# Patient Record
Sex: Male | Born: 1995 | Hispanic: Yes | Marital: Married | Smoking: Never smoker
Health system: Southern US, Community
[De-identification: ages and names within clinical notes are randomized; demographics above are authoritative.]

## PROBLEM LIST (undated history)

## (undated) DIAGNOSIS — J45909 Unspecified asthma, uncomplicated: Secondary | ICD-10-CM

---

## 2013-02-26 ENCOUNTER — Emergency Department (HOSPITAL_BASED_OUTPATIENT_CLINIC_OR_DEPARTMENT_OTHER)
Admission: EM | Admit: 2013-02-26 | Discharge: 2013-02-27 | Disposition: A | Discharge status: Home / Self Care | Payer: Self-pay | Attending: Emergency Medicine | Admitting: Emergency Medicine

## 2013-02-26 ENCOUNTER — Encounter (HOSPITAL_BASED_OUTPATIENT_CLINIC_OR_DEPARTMENT_OTHER): Payer: Self-pay | Admitting: *Deleted

## 2013-02-26 ENCOUNTER — Emergency Department (HOSPITAL_BASED_OUTPATIENT_CLINIC_OR_DEPARTMENT_OTHER): Payer: Self-pay

## 2013-02-26 DIAGNOSIS — S93409A Sprain of unspecified ligament of unspecified ankle, initial encounter: Secondary | ICD-10-CM | POA: Insufficient documentation

## 2013-02-26 DIAGNOSIS — S93401A Sprain of unspecified ligament of right ankle, initial encounter: Secondary | ICD-10-CM

## 2013-02-26 DIAGNOSIS — Y9239 Other specified sports and athletic area as the place of occurrence of the external cause: Secondary | ICD-10-CM | POA: Insufficient documentation

## 2013-02-26 DIAGNOSIS — W219XXA Striking against or struck by unspecified sports equipment, initial encounter: Secondary | ICD-10-CM | POA: Insufficient documentation

## 2013-02-26 DIAGNOSIS — Y9366 Activity, soccer: Secondary | ICD-10-CM | POA: Insufficient documentation

## 2013-02-26 NOTE — ED Notes (Signed)
To xray via w/c.

## 2013-02-26 NOTE — ED Notes (Signed)
Patient was playing soccer and thinks he sprained his right ankle

## 2013-02-26 NOTE — ED Provider Notes (Signed)
CSN: [id number]     Arrival date & time 02/26/13  2244 History     First MD Initiated Contact with Patient 02/26/13 2256     Chief Complaint  Patient presents with  . Ankle Pain   (Consider location/radiation/quality/duration/timing/severity/associated sxs/prior Treatment) HPI Comments: Otherwise healthy 17 year old male who presents to the ER after playing soccer and having another player run into him and hit him in the right ankle - reports unable to walk on the ankle without pain.  No prior injury to the area.  Patient is a 17 y.o. male presenting with ankle pain. The history is provided by the patient. No language interpreter was used.  Ankle Pain Location:  Ankle Time since incident:  2 hours Injury: yes   Mechanism of injury comment:  Playing soccer Ankle location:  R ankle Pain details:    Quality:  Aching   Radiates to:  Does not radiate   Severity:  Moderate   Onset quality:  Sudden   Timing:  Constant   Progression:  Worsening Chronicity:  New Dislocation: no   Foreign body present:  No foreign bodies Tetanus status:  Unknown Prior injury to area:  No Relieved by:  Nothing Worsened by:  Nothing tried Ineffective treatments:  None tried Associated symptoms: decreased ROM   Associated symptoms: no back pain, no fatigue, no fever, no itching, no muscle weakness, no neck pain, no numbness, no stiffness, no swelling and no tingling     History reviewed. No pertinent past medical history. History reviewed. No pertinent past surgical history. History reviewed. No pertinent family history. History  Substance Use Topics  . Smoking status: Never Smoker   . Smokeless tobacco: Not on file  . Alcohol Use: No    Review of Systems  Constitutional: Negative for fever and fatigue.  HENT: Negative for neck pain.   Musculoskeletal: Negative for back pain and stiffness.  Skin: Negative for itching.  All other systems reviewed and are negative.    Allergies  Review of  patient's allergies indicates no known allergies.  Home Medications  No current outpatient prescriptions on file. BP 111/49  Pulse 69  Temp(Src) 98.2 F (36.8 C) (Oral)  Resp 20  Ht 5\' 8"  (1.727 m)  Wt 150 lb (68.04 kg)  BMI 22.81 kg/m2  SpO2 100% Physical Exam  Nursing note and vitals reviewed. Constitutional: He is oriented to person, place, and time. He appears well-developed and well-nourished. No distress.  HENT:  Head: Normocephalic and atraumatic.  Right Ear: External ear normal.  Left Ear: External ear normal.  Nose: Nose normal.  Mouth/Throat: Oropharynx is clear and moist. No oropharyngeal exudate.  Eyes: Conjunctivae and EOM are normal. Pupils are equal, round, and reactive to light.  Neck: Normal range of motion. Neck supple.  Cardiovascular: Normal rate, regular rhythm and normal heart sounds.  Exam reveals no gallop and no friction rub.   No murmur heard. Pulmonary/Chest: Effort normal and breath sounds normal. No respiratory distress. He has no wheezes.  Abdominal: Soft. Bowel sounds are normal. He exhibits no distension. There is no tenderness.  Musculoskeletal:       Right ankle: He exhibits swelling. He exhibits normal range of motion, no ecchymosis, no deformity and normal pulse. Tenderness. Lateral malleolus tenderness found. Achilles tendon normal.       Feet:  Neurological: He is alert and oriented to person, place, and time. No cranial nerve deficit.  Skin: Skin is warm and dry. No rash noted. No erythema.  No pallor.  Psychiatric: He has a normal mood and affect. His behavior is normal. Judgment and thought content normal.    ED Course   Procedures (including critical care time)  Labs Reviewed - No data to display No results found for this or any previous visit. Dg Ankle Complete Right  02/27/2013   *RADIOLOGY REPORT*  Clinical Data: 17 year old male status post injury with pain laterally.  RIGHT ANKLE - COMPLETE 3+ VIEW  Comparison: None.  Findings:  Normal mortise joint alignment.  Talar dome intact. Calcaneus intact. Bone mineralization is within normal limits.  No acute fracture identified.  IMPRESSION: No acute fracture or dislocation identified about the right ankle.   Original Report Authenticated By: Erskine Speed, M.D.   Dg Foot Complete Right  02/27/2013   *RADIOLOGY REPORT*  Clinical Data: 17 year old male status post injury with pain.  RIGHT FOOT COMPLETE - 3+ VIEW  Comparison: Right ankle series from the same day reported separately.  Findings: Bone mineralization is within normal limits.  Calcaneus intact.  Joint spaces and alignment within normal limits.  No acute fracture identified.  IMPRESSION: No acute fracture or dislocation identified about the right foot.   Original Report Authenticated By: Erskine Speed, M.D.    Right ankle sprain No results found. No diagnosis found.  MDM  Patient with twisting injury to right ankle - no fracture, will place in ASO and crutches and short course of pain medication.  Izola Price Marisue Humble, PA-C 02/27/13 0023

## 2013-02-26 NOTE — ED Notes (Signed)
Return from xray condition unchanged

## 2013-02-27 MED ORDER — HYDROCODONE-ACETAMINOPHEN 5-325 MG PO TABS: 1.0000 | ORAL_TABLET | ORAL | Status: DC | PRN

## 2013-02-27 NOTE — Discharge Instructions (Signed)

## 2013-02-27 NOTE — ED Provider Notes (Signed)
Medical screening examination/treatment/procedure(s) were performed by non-physician practitioner and as supervising physician I was immediately available for consultation/collaboration.   Marcelo Ickes, MD 02/27/13 0146 

## 2013-12-21 ENCOUNTER — Encounter (HOSPITAL_BASED_OUTPATIENT_CLINIC_OR_DEPARTMENT_OTHER): Payer: Self-pay | Admitting: Emergency Medicine

## 2013-12-21 ENCOUNTER — Emergency Department (HOSPITAL_BASED_OUTPATIENT_CLINIC_OR_DEPARTMENT_OTHER)
Admission: EM | Admit: 2013-12-21 | Discharge: 2013-12-21 | Disposition: A | Discharge status: Home / Self Care | Payer: 59 | Attending: Emergency Medicine | Admitting: Emergency Medicine

## 2013-12-21 DIAGNOSIS — K529 Noninfective gastroenteritis and colitis, unspecified: Secondary | ICD-10-CM

## 2013-12-21 DIAGNOSIS — K5289 Other specified noninfective gastroenteritis and colitis: Secondary | ICD-10-CM | POA: Insufficient documentation

## 2013-12-21 DIAGNOSIS — Z79899 Other long term (current) drug therapy: Secondary | ICD-10-CM | POA: Insufficient documentation

## 2013-12-21 MED ORDER — ONDANSETRON 4 MG PO TBDP
4.0000 mg | ORAL_TABLET | Freq: Once | ORAL | Status: AC
  Administered 2013-12-21: 4 mg via ORAL
  Filled 2013-12-21: qty 1

## 2013-12-21 MED ORDER — ONDANSETRON HCL 4 MG PO TABS: 4.0000 mg | ORAL_TABLET | Freq: Four times a day (QID) | ORAL | Status: DC

## 2013-12-21 NOTE — ED Provider Notes (Addendum)
CSN: [id number]     Arrival date & time 12/21/13  1058 History   First MD Initiated Contact with Patient 12/21/13 1112     Chief Complaint  Patient presents with  . Diarrhea     (Consider location/radiation/quality/duration/timing/severity/associated sxs/prior Treatment) Patient is a 18 y.o. male presenting with diarrhea. The history is provided by the patient.  Diarrhea Quality:  Watery Severity:  Moderate Onset quality:  Sudden Number of episodes:  10 since last night but only 1 today Duration:  12 hours Timing:  Constant Progression:  Improving Relieved by:  None tried Worsened by:  Nothing tried Associated symptoms: vomiting   Associated symptoms: no abdominal pain and no fever   Risk factors: sick contacts   Risk factors: no recent antibiotic use, no suspicious food intake and no travel to endemic areas     History reviewed. No pertinent past medical history. History reviewed. No pertinent past surgical history. No family history on file. History  Substance Use Topics  . Smoking status: Never Smoker   . Smokeless tobacco: Not on file  . Alcohol Use: No    Review of Systems  Constitutional: Negative for fever.  Gastrointestinal: Positive for vomiting and diarrhea. Negative for abdominal pain.  All other systems reviewed and are negative.     Allergies  Review of patient's allergies indicates no known allergies.  Home Medications   Prior to Admission medications   Medication Sig Start Date End Date Taking? Authorizing Provider  HYDROcodone-acetaminophen (NORCO/VICODIN) 5-325 MG per tablet Take 1 tablet by mouth every 4 (four) hours as needed for pain. 02/27/13   Izola Price. Sanford, PA-C   BP 118/54  Pulse 72  Temp(Src) 98.4 F (36.9 C) (Oral)  Resp 16  Ht 5\' 10"  (1.778 m)  Wt 160 lb (72.576 kg)  BMI 22.96 kg/m2  SpO2 100% Physical Exam  Nursing note and vitals reviewed. Constitutional: He is oriented to person, place, and time. He appears  well-developed and well-nourished. No distress.  HENT:  Head: Normocephalic and atraumatic.  Mouth/Throat: Oropharynx is clear and moist.  Eyes: Conjunctivae and EOM are normal. Pupils are equal, round, and reactive to light.  Neck: Normal range of motion. Neck supple.  Cardiovascular: Normal rate, regular rhythm and intact distal pulses.   No murmur heard. Pulmonary/Chest: Effort normal and breath sounds normal. No respiratory distress. He has no wheezes. He has no rales.  Abdominal: Soft. He exhibits no distension. There is no tenderness. There is no rebound and no guarding.  Musculoskeletal: Normal range of motion. He exhibits no edema and no tenderness.  Neurological: He is alert and oriented to person, place, and time.  Skin: Skin is warm and dry. No rash noted. No erythema.  Psychiatric: He has a normal mood and affect. His behavior is normal.    ED Course  Procedures (including critical care time) Labs Review Labs Reviewed - No data to display  Imaging Review No results found.   EKG Interpretation None      MDM   Final diagnoses:  Gastroenteritis    Pt with symptoms most consistent with a viral process with vomitting/diarrhea and girlfriend with similar sx.  Denies bad food exposure and recent travel out of the country.  No recent abx.  No hx concerning for GU pathology or kidney stones.  Pt is awake and alert on exam without peritoneal signs. Given ODT zofran and will po challenge.      Gwyneth Sprout, MD 12/21/13 1124  Gwyneth Sprout, MD 12/21/13  1226 

## 2013-12-21 NOTE — ED Notes (Signed)
C/o n/v/d-started yesterday-NAD-steady gait into triage

## 2014-05-16 ENCOUNTER — Encounter (HOSPITAL_BASED_OUTPATIENT_CLINIC_OR_DEPARTMENT_OTHER): Payer: Self-pay | Admitting: *Deleted

## 2014-05-16 ENCOUNTER — Emergency Department (HOSPITAL_BASED_OUTPATIENT_CLINIC_OR_DEPARTMENT_OTHER): Payer: 59

## 2014-05-16 ENCOUNTER — Emergency Department (HOSPITAL_BASED_OUTPATIENT_CLINIC_OR_DEPARTMENT_OTHER)
Admission: EM | Admit: 2014-05-16 | Discharge: 2014-05-16 | Disposition: A | Discharge status: Home / Self Care | Payer: 59 | Attending: Emergency Medicine | Admitting: Emergency Medicine

## 2014-05-16 DIAGNOSIS — R197 Diarrhea, unspecified: Secondary | ICD-10-CM | POA: Insufficient documentation

## 2014-05-16 DIAGNOSIS — R109 Unspecified abdominal pain: Secondary | ICD-10-CM

## 2014-05-16 DIAGNOSIS — R110 Nausea: Secondary | ICD-10-CM | POA: Insufficient documentation

## 2014-05-16 LAB — CBC WITH DIFFERENTIAL/PLATELET
BASOS ABS: 0 10*3/uL (ref 0.0–0.1)
BASOS PCT: 0 % (ref 0–1)
Eosinophils Absolute: 0.1 10*3/uL (ref 0.0–0.7)
Eosinophils Relative: 1 % (ref 0–5)
HCT: 40.8 % (ref 39.0–52.0)
HEMOGLOBIN: 14.3 g/dL (ref 13.0–17.0)
Lymphocytes Relative: 30 % (ref 12–46)
Lymphs Abs: 2.8 10*3/uL (ref 0.7–4.0)
MCH: 30.4 pg (ref 26.0–34.0)
MCHC: 35 g/dL (ref 30.0–36.0)
MCV: 86.8 fL (ref 78.0–100.0)
Monocytes Absolute: 0.9 10*3/uL (ref 0.1–1.0)
Monocytes Relative: 10 % (ref 3–12)
NEUTROS ABS: 5.6 10*3/uL (ref 1.7–7.7)
NEUTROS PCT: 59 % (ref 43–77)
Platelets: 222 10*3/uL (ref 150–400)
RBC: 4.7 MIL/uL (ref 4.22–5.81)
RDW: 12.3 % (ref 11.5–15.5)
WBC: 9.4 10*3/uL (ref 4.0–10.5)

## 2014-05-16 LAB — URINALYSIS, ROUTINE W REFLEX MICROSCOPIC
BILIRUBIN URINE: NEGATIVE
Glucose, UA: NEGATIVE mg/dL
HGB URINE DIPSTICK: NEGATIVE
Ketones, ur: NEGATIVE mg/dL
Leukocytes, UA: NEGATIVE
NITRITE: NEGATIVE
PH: 6.5 (ref 5.0–8.0)
Protein, ur: NEGATIVE mg/dL
SPECIFIC GRAVITY, URINE: 1.023 (ref 1.005–1.030)
Urobilinogen, UA: 1 mg/dL (ref 0.0–1.0)

## 2014-05-16 LAB — COMPREHENSIVE METABOLIC PANEL
ALBUMIN: 3.8 g/dL (ref 3.5–5.2)
ALK PHOS: 91 U/L (ref 39–117)
ALT: 33 U/L (ref 0–53)
AST: 25 U/L (ref 0–37)
Anion gap: 11 (ref 5–15)
BILIRUBIN TOTAL: 1 mg/dL (ref 0.3–1.2)
BUN: 15 mg/dL (ref 6–23)
CHLORIDE: 103 meq/L (ref 96–112)
CO2: 28 mEq/L (ref 19–32)
Calcium: 9.4 mg/dL (ref 8.4–10.5)
Creatinine, Ser: 1 mg/dL (ref 0.50–1.35)
GFR calc Af Amer: >90 mL/min (ref >90)
GFR calc non Af Amer: >90 mL/min (ref >90)
Glucose, Bld: 90 mg/dL (ref 70–99)
POTASSIUM: 4.1 meq/L (ref 3.7–5.3)
Sodium: 142 mEq/L (ref 137–147)
Total Protein: 7.5 g/dL (ref 6.0–8.3)

## 2014-05-16 LAB — LIPASE, BLOOD: Lipase: 31 U/L (ref 11–59)

## 2014-05-16 MED ORDER — ONDANSETRON HCL 4 MG PO TABS: 4.0000 mg | ORAL_TABLET | Freq: Four times a day (QID) | ORAL | Status: AC

## 2014-05-16 MED ORDER — IOHEXOL 300 MG/ML  SOLN
25.0000 mL | Freq: Once | INTRAMUSCULAR | Status: AC | PRN
  Administered 2014-05-16: 25 mL via ORAL

## 2014-05-16 MED ORDER — IOHEXOL 300 MG/ML  SOLN
100.0000 mL | Freq: Once | INTRAMUSCULAR | Status: AC | PRN
  Administered 2014-05-16: 100 mL via INTRAVENOUS

## 2014-05-16 NOTE — ED Notes (Signed)
Pt c/o n/d x 2 days denies abd pain.

## 2014-05-16 NOTE — ED Notes (Signed)
Patient transported to CT 

## 2014-05-16 NOTE — ED Provider Notes (Signed)
CSN: [id number]     Arrival date & time 05/16/14  1455 History   First MD Initiated Contact with Patient 05/16/14 1624     Chief Complaint  Patient presents with  . Diarrhea     (Consider location/radiation/quality/duration/timing/severity/associated sxs/prior Treatment) HPI Comments: Patient with 2 day history of nausea, diarrhea and abdominal pain. No vomiting, no fever. No urinary symptoms. He has had sick contacts at work with similar symptoms. No recent travel or antibiotic use. Denies any fever. Denies any chest pain or shortness of breath. States he has had 7-8 episodes of nonbloody diarrhea today that is loose and watery. Denies any blood in the stool. Denies any vomiting. Still has an appetite.  The history is provided by the patient.    History reviewed. No pertinent past medical history. History reviewed. No pertinent past surgical history. History reviewed. No pertinent family history. History  Substance Use Topics  . Smoking status: Never Smoker   . Smokeless tobacco: Not on file  . Alcohol Use: No    Review of Systems  Constitutional: Positive for activity change and appetite change. Negative for fever.  HENT: Negative for congestion and rhinorrhea.   Eyes: Negative for visual disturbance.  Respiratory: Negative for cough, chest tightness and shortness of breath.   Cardiovascular: Negative for chest pain.  Gastrointestinal: Positive for nausea, abdominal pain and diarrhea. Negative for vomiting.  Genitourinary: Negative for dysuria, hematuria and testicular pain.  Musculoskeletal: Negative for myalgias, back pain and arthralgias.  Skin: Negative for rash.  Neurological: Negative for dizziness, weakness and headaches.  A complete 10 system review of systems was obtained and all systems are negative except as noted in the HPI and PMH.      Allergies  Review of patient's allergies indicates no known allergies.  Home Medications   Prior to Admission  medications   Medication Sig Start Date End Date Taking? Authorizing Provider  ondansetron (ZOFRAN) 4 MG tablet Take 1 tablet (4 mg total) by mouth every 6 (six) hours. 05/16/14   Glynn OctaveStephen Keyonta Madrid, MD   BP 123/72 mmHg  Pulse 50  Temp(Src) 98.1 F (36.7 C) (Oral)  Resp 16  Ht 5\' 10"  (1.778 m)  Wt 160 lb (72.576 kg)  BMI 22.96 kg/m2  SpO2 100% Physical Exam  Constitutional: He is oriented to person, place, and time. He appears well-developed and well-nourished. No distress.  HENT:  Head: Normocephalic and atraumatic.  Mouth/Throat: Oropharynx is clear and moist. No oropharyngeal exudate.  Eyes: Conjunctivae and EOM are normal. Pupils are equal, round, and reactive to light.  Neck: Normal range of motion. Neck supple.  No meningismus.  Cardiovascular: Normal rate, regular rhythm, normal heart sounds and intact distal pulses.   No murmur heard. Pulmonary/Chest: Effort normal and breath sounds normal. No respiratory distress.  Abdominal: Soft. There is tenderness. There is no rebound and no guarding.  TTP R abdomen  Musculoskeletal: Normal range of motion. He exhibits no edema or tenderness.  Neurological: He is alert and oriented to person, place, and time. No cranial nerve deficit. He exhibits normal muscle tone. Coordination normal.  No ataxia on finger to nose bilaterally. No pronator drift. 5/5 strength throughout. CN 2-12 intact. Negative Romberg. Equal grip strength. Sensation intact. Gait is normal.   Skin: Skin is warm.  Psychiatric: He has a normal mood and affect. His behavior is normal.  Nursing note and vitals reviewed.   ED Course  Procedures (including critical care time) Labs Review Labs Reviewed  CBC WITH DIFFERENTIAL  COMPREHENSIVE METABOLIC PANEL  LIPASE, BLOOD  URINALYSIS, ROUTINE W REFLEX MICROSCOPIC    Imaging Review Ct Abdomen Pelvis W Contrast  05/16/2014   CLINICAL DATA:  Generalized abdominal pain.  Diarrhea.  EXAM: CT ABDOMEN AND PELVIS WITH CONTRAST   TECHNIQUE: Multidetector CT imaging of the abdomen and pelvis was performed using the standard protocol following bolus administration of intravenous contrast.  CONTRAST:  25mL OMNIPAQUE IOHEXOL 300 MG/ML SOLN, 100mL OMNIPAQUE IOHEXOL 300 MG/ML SOLN  COMPARISON:  None.  FINDINGS: Lower Chest:  Unremarkable.  Hepatobiliary: No masses or other significant abnormality identified.  Pancreas: No mass, inflammatory changes, or other parenchymal abnormality identified.  Spleen:  Within normal limits in size and appearance.  Adrenal Glands:  No mass identified.  Kidneys/Urinary Tract: No masses identified. No evidence of hydronephrosis.  Stomach/Bowel/Peritoneum: No evidence of wall thickening, mass, or obstruction. Although the appendix is not well visualized on this exam, and no inflammatory process seen in the area of the cecum or elsewhere.  Vascular/Lymphatic: No pathologically enlarged lymph nodes identified. No other significant abnormality identified.  Reproductive:  No mass or other significant abnormality identified.  Other: Normal L5 pars defects noted without associated spondylolisthesis.  Musculoskeletal:  No suspicious bone lesions identified.  IMPRESSION: Negative. No acute findings or other significant abnormality identified.   Electronically Signed   By: Myles RosenthalJohn  Stahl M.D.   On: 05/16/2014 18:05     EKG Interpretation None      MDM   Final diagnoses:  Abdominal pain  Diarrhea   2 days of nausea, abdominal pain, diarrhea.  Sick contacts at work.  Labs unremarkable.  UA negative.  CT with appendix not identified but no inflammation seen.  Patient tolerating PO. No vomiting.  Suspect viral illness as source of diarrhea.  Continue symptom control and hydration at home.  Follow up with PCP. Return precautions discussed.  Glynn OctaveStephen Linus Weckerly, MD 05/17/14 [phone]

## 2014-05-16 NOTE — Discharge Instructions (Signed)

## 2015-09-22 IMAGING — CT CT ABD-PELV W/ CM
2 of 4 series · 17 of 46 positions shown, 19 images · IV contrast (omnipaque)
Comparison: None.

CLINICAL DATA: Generalized abdominal pain.  Diarrhea.

EXAM:
CT ABDOMEN AND PELVIS WITH CONTRAST
TECHNIQUE: Multidetector CT imaging of the abdomen and pelvis was performed
using the standard protocol following bolus administration of
intravenous contrast.
CONTRAST:  25mL OMNIPAQUE IOHEXOL 300 MG/ML SOLN, 100mL OMNIPAQUE
IOHEXOL 300 MG/ML SOLN

[Series 2: abd/pelvis 5.0 b31f · axial · 0.72mm/px · z∈[-476,-86]mm · 14 of 86 slices shown, 16 images]
[im 4/86  soft-tissue]
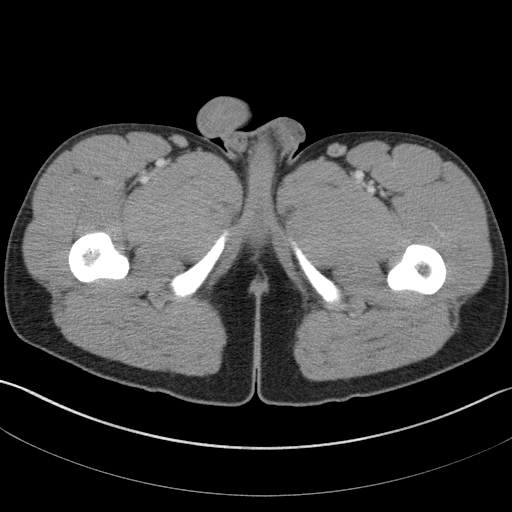
[im 4/86  bone]
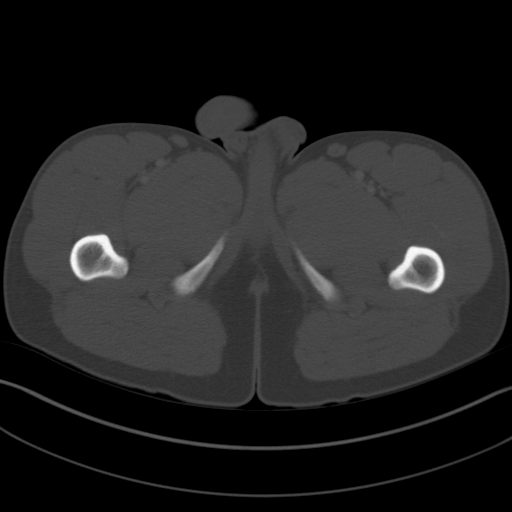
[im 11/86  soft-tissue]
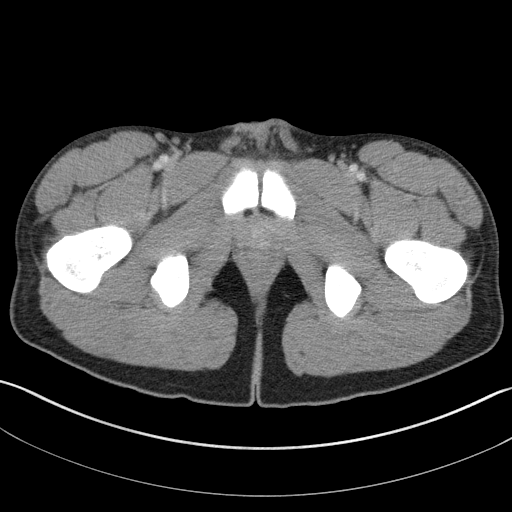
[im 18/86  soft-tissue]
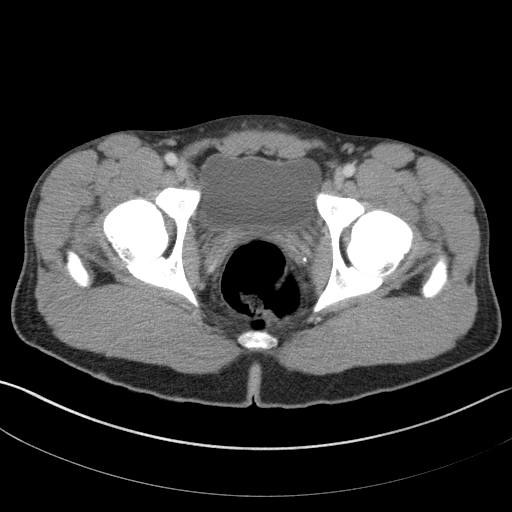
[im 22/86  soft-tissue]
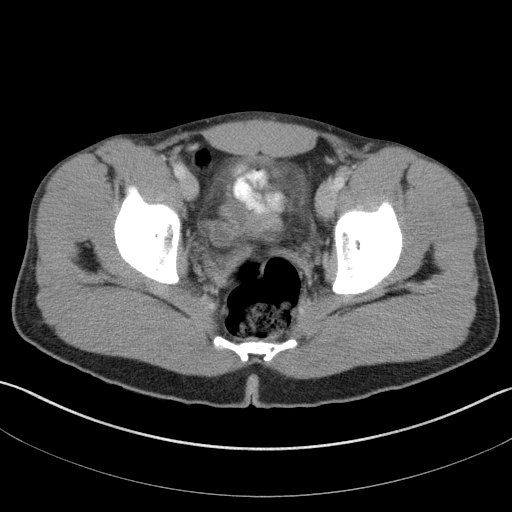
[im 29/86  soft-tissue]
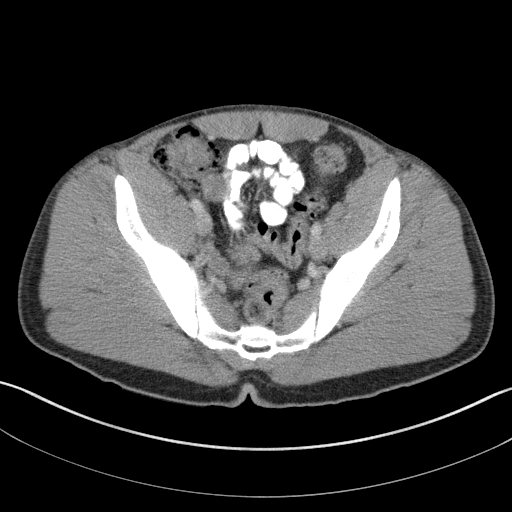
[im 36/86  soft-tissue]
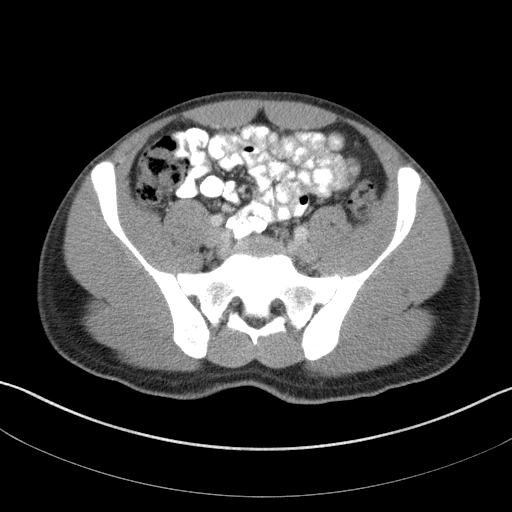
[im 39/86  soft-tissue]
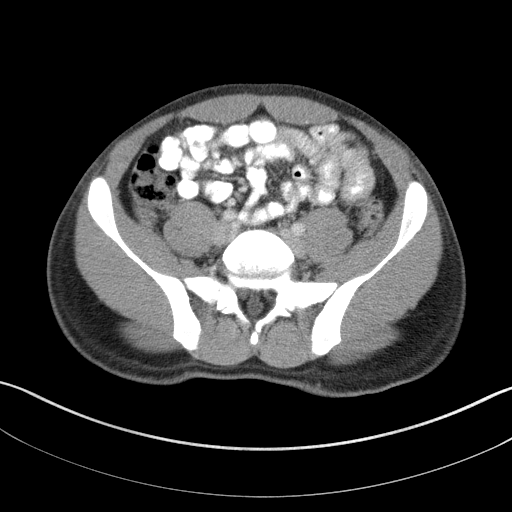
[im 47/86  soft-tissue]
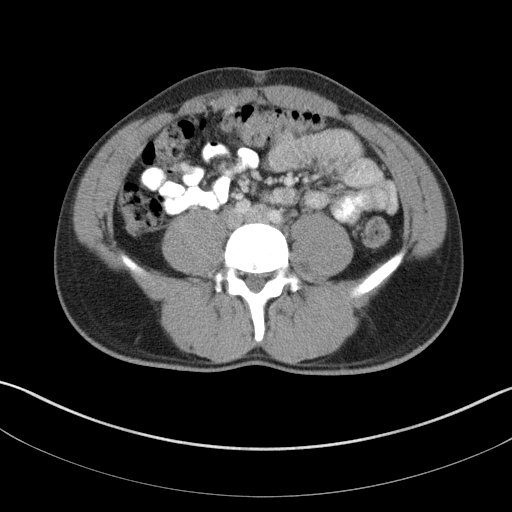
[im 50/86  soft-tissue]
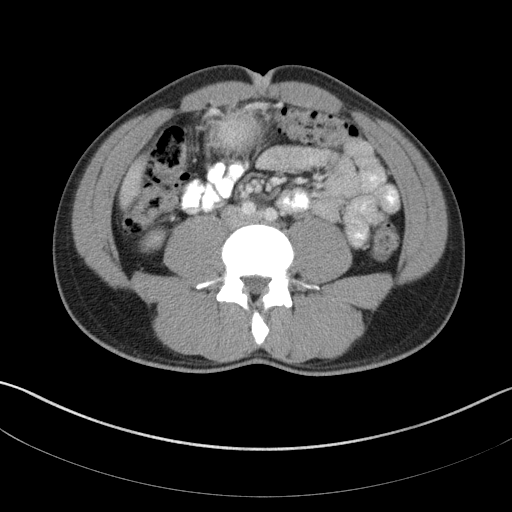
[im 50/86  bone]
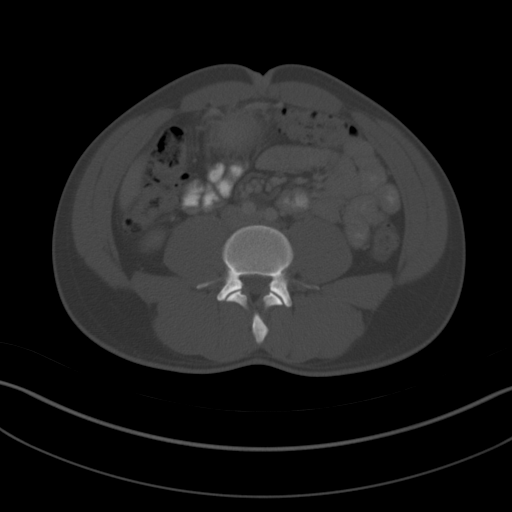
[im 57/86  soft-tissue]
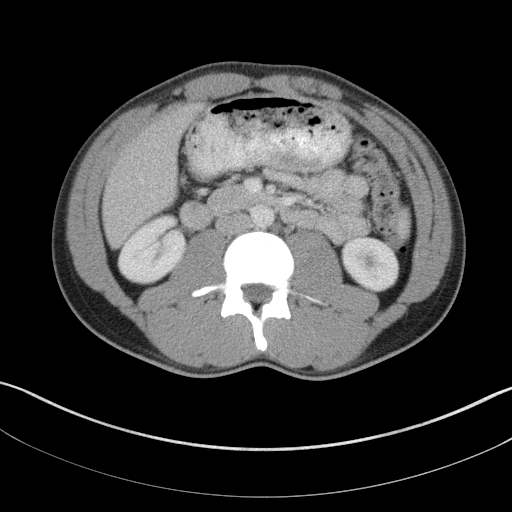
[im 64/86  soft-tissue]
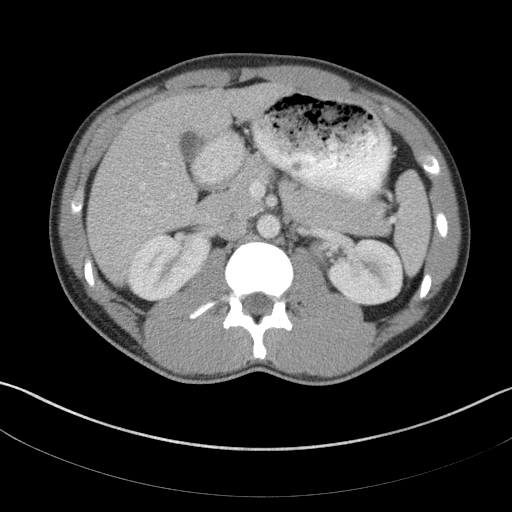
[im 68/86  soft-tissue]
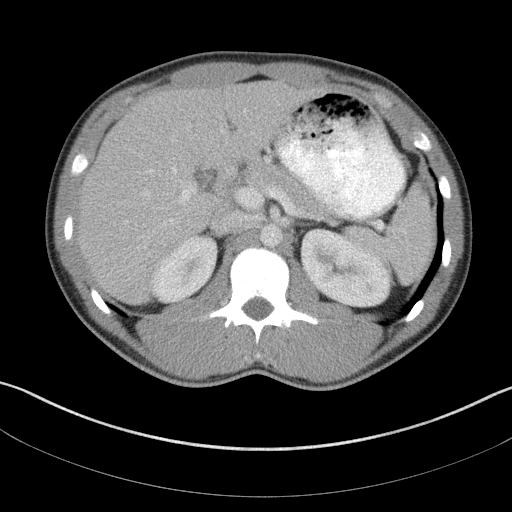
[im 75/86  soft-tissue]
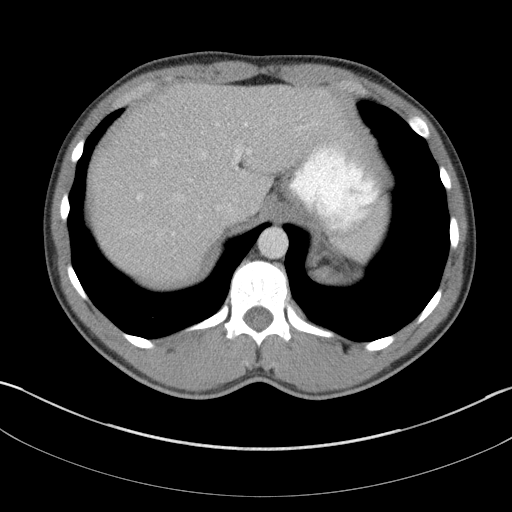
[im 82/86  soft-tissue]
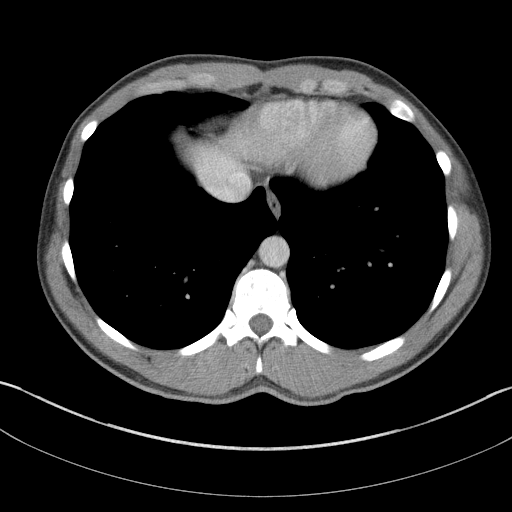

[Series 5: abd/pelvis 3.0 coronal · coronal · 0.83mm/px · 3 of 78 slices shown]
[im 26/78  soft-tissue]
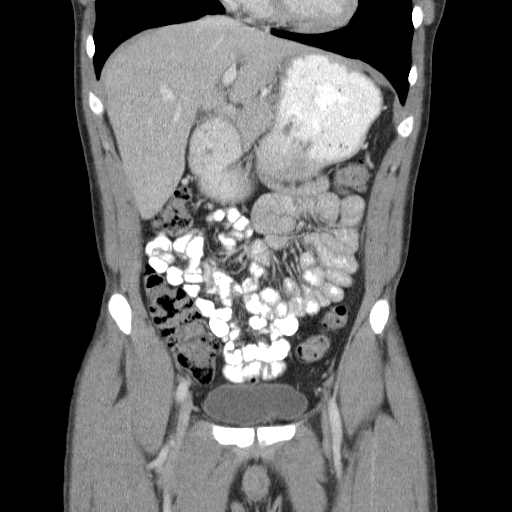
[im 35/78  soft-tissue]
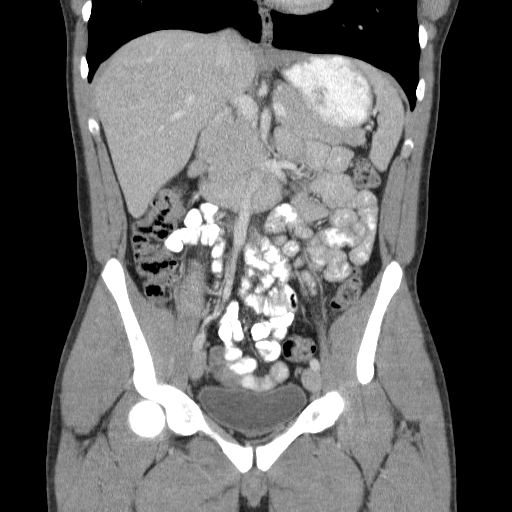
[im 43/78  soft-tissue]
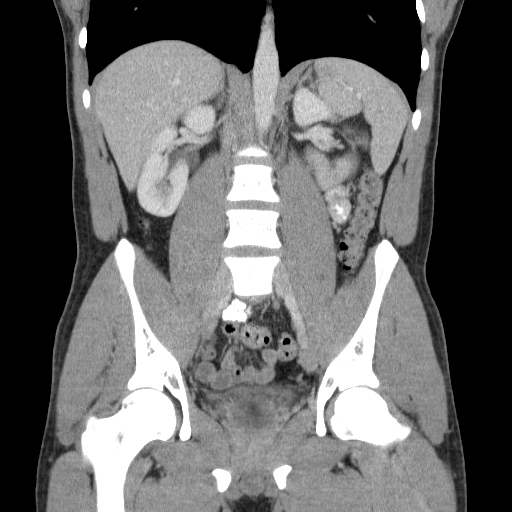

[17 of 46 positions shown; findings below may reference images not displayed]

FINDINGS: Lower Chest:  Unremarkable.

Hepatobiliary: No masses or other significant abnormality
identified.

Pancreas: No mass, inflammatory changes, or other parenchymal
abnormality identified.

Spleen:  Within normal limits in size and appearance.

Adrenal Glands:  No mass identified.

Kidneys/Urinary Tract: No masses identified. No evidence of
hydronephrosis.

Stomach/Bowel/Peritoneum: No evidence of wall thickening, mass, or
obstruction. Although the appendix is not well visualized on this
exam, and no inflammatory process seen in the area of the cecum or
elsewhere.

Vascular/Lymphatic: No pathologically enlarged lymph nodes
identified. No other significant abnormality identified.

Reproductive:  No mass or other significant abnormality identified.

Other: Normal L5 pars defects noted without associated
spondylolisthesis.

Musculoskeletal:  No suspicious bone lesions identified.
IMPRESSION: Negative. No acute findings or other significant abnormality
identified.

## 2016-09-13 ENCOUNTER — Emergency Department (HOSPITAL_BASED_OUTPATIENT_CLINIC_OR_DEPARTMENT_OTHER): Payer: Self-pay

## 2016-09-13 ENCOUNTER — Encounter (HOSPITAL_BASED_OUTPATIENT_CLINIC_OR_DEPARTMENT_OTHER): Payer: Self-pay

## 2016-09-13 ENCOUNTER — Emergency Department (HOSPITAL_BASED_OUTPATIENT_CLINIC_OR_DEPARTMENT_OTHER)
Admission: EM | Admit: 2016-09-13 | Discharge: 2016-09-13 | Disposition: A | Discharge status: Home / Self Care | Payer: Self-pay | Attending: Emergency Medicine | Admitting: Emergency Medicine

## 2016-09-13 DIAGNOSIS — W25XXXA Contact with sharp glass, initial encounter: Secondary | ICD-10-CM | POA: Insufficient documentation

## 2016-09-13 DIAGNOSIS — J45909 Unspecified asthma, uncomplicated: Secondary | ICD-10-CM | POA: Insufficient documentation

## 2016-09-13 DIAGNOSIS — Y939 Activity, unspecified: Secondary | ICD-10-CM | POA: Insufficient documentation

## 2016-09-13 DIAGNOSIS — S61411A Laceration without foreign body of right hand, initial encounter: Secondary | ICD-10-CM | POA: Insufficient documentation

## 2016-09-13 DIAGNOSIS — Y999 Unspecified external cause status: Secondary | ICD-10-CM | POA: Insufficient documentation

## 2016-09-13 DIAGNOSIS — Y929 Unspecified place or not applicable: Secondary | ICD-10-CM | POA: Insufficient documentation

## 2016-09-13 HISTORY — DX: Unspecified asthma, uncomplicated: J45.909

## 2016-09-13 MED ORDER — LIDOCAINE HCL 2 % IJ SOLN
INTRAMUSCULAR | Status: AC
  Filled 2016-09-13: qty 20

## 2016-09-13 MED ORDER — "THROMBI-PAD 3""X3"" EX PADS"
MEDICATED_PAD | CUTANEOUS | Status: AC
  Administered 2016-09-13: 1
  Filled 2016-09-13: qty 1

## 2016-09-13 NOTE — ED Triage Notes (Signed)
Pt states fell on a glass table and his hand went through it; multiple small lacs to rt hand and fingers with bleeding control, lac to 4th digit with active bleeding

## 2016-09-13 NOTE — Discharge Instructions (Signed)
Stitch removal 10 days

## 2016-09-13 NOTE — ED Provider Notes (Signed)
[facility]   CSN: [id number] Arrival date & time: 09/13/16  0506     History   Chief Complaint Chief Complaint  Patient presents with  . Extremity Laceration    HPI Dustin Hernandez is a 21 y.o. male.  The history is provided by the patient.  Laceration   The incident occurred less than 1 hour ago. Pain location: right dorsal hand. Size: multiple 5- 7mm punctures maceration 1.5 cm ring finger. Injury mechanism: fell through glass table. The pain is moderate. The pain has been constant since onset. He reports no foreign bodies present. His tetanus status is UTD.    Past Medical History:  Diagnosis Date  . Asthma     There are no active problems to display for this patient.   History reviewed. No pertinent surgical history.     Home Medications    Prior to Admission medications   Medication Sig Start Date End Date Taking? Authorizing Provider  ondansetron (ZOFRAN) 4 MG tablet Take 1 tablet (4 mg total) by mouth every 6 (six) hours. 05/16/14   Glynn OctaveStephen Rancour, MD    Family History No family history on file.  Social History Social History  Substance Use Topics  . Smoking status: Never Smoker  . Smokeless tobacco: Never Used  . Alcohol use Yes     Allergies   Patient has no known allergies.   Review of Systems Review of Systems  Skin: Negative for wound.  Neurological: Negative for tremors, seizures, syncope, speech difficulty, weakness, light-headedness and numbness.  All other systems reviewed and are negative.    Physical Exam Updated Vital Signs BP 121/86 (BP Location: Left Arm)   Pulse 95   Temp 98.2 F (36.8 C) (Oral)   Resp 16   Ht 5\' 10"  (1.778 m)   Wt 170 lb (77.1 kg)   SpO2 100%   BMI 24.39 kg/m   Physical Exam  Constitutional: He is oriented to person, place, and time. He appears well-developed and well-nourished. No distress.  HENT:  Head: Normocephalic and atraumatic. Head is without raccoon's eyes and  without Battle's sign.  Mouth/Throat: No oropharyngeal exudate.  Eyes: EOM are normal. Pupils are equal, round, and reactive to light.  Neck: Normal range of motion. Neck supple.  Cardiovascular: Normal rate, regular rhythm and intact distal pulses.   Pulmonary/Chest: Effort normal and breath sounds normal. He has no wheezes. He has no rales.  Abdominal: Soft. He exhibits no mass. There is no tenderness. There is no rebound and no guarding.  Musculoskeletal: Normal range of motion.  Neurological: He is alert and oriented to person, place, and time.  Skin: Skin is warm and dry. Capillary refill takes less than 2 seconds.  Psychiatric: He has a normal mood and affect.     ED Treatments / Results   Vitals:   09/13/16 0519  BP: 121/86  Pulse: 95  Resp: 16  Temp: 98.2 F (36.8 C)    Radiology Dg Hand Complete Right  Result Date: 09/13/2016 CLINICAL DATA:  Put hand through table. Assess for foreign body. Initial encounter. EXAM: RIGHT HAND - COMPLETE 3+ VIEW COMPARISON:  None. FINDINGS: There is no evidence of fracture or dislocation. The joint spaces are preserved. The carpal rows are intact, and demonstrate normal alignment. The known soft tissue laceration is not well characterized on radiograph. No radiopaque foreign bodies are seen. IMPRESSION: No evidence of fracture or dislocation. No radiopaque foreign bodies seen. Electronically Signed   By: Leotis ShamesJeffery  Chang M.D.   On: 09/13/2016 06:05    Procedures .Marland KitchenLaceration Repair Date/Time: 09/13/2016 7:17 AM Performed by: Cy Blamer Authorized by: Cy Blamer   Consent:    Consent obtained:  Verbal   Consent given by:  Patient   Risks discussed:  Infection and poor cosmetic result   Alternatives discussed:  No treatment Anesthesia (see MAR for exact dosages):    Anesthesia method:  Local infiltration   Local anesthetic:  Lidocaine 2% w/o epi Laceration details:    Location:  Finger   Finger location:  R ring finger   Length  (cm):  1.5   Depth (mm):  1 Repair type:    Repair type:  Simple Pre-procedure details:    Preparation:  Patient was prepped and draped in usual sterile fashion Exploration:    Hemostasis achieved with:  Direct pressure   Wound exploration: wound explored through full range of motion     Wound extent: no areolar tissue violation noted, no foreign bodies/material noted, no nerve damage noted, no tendon damage noted and no vascular damage noted     Contaminated: no   Treatment:    Area cleansed with:  Betadine   Amount of cleaning:  Extensive   Irrigation solution:  Sterile saline   Visualized foreign bodies/material removed: no   Skin repair:    Repair method:  Sutures   Suture size:  4-0   Suture material:  Nylon   Suture technique:  Simple interrupted   Number of sutures:  7 Approximation:    Approximation:  Close   Vermilion border: well-aligned   Post-procedure details:    Dressing:  Sterile dressing   Patient tolerance of procedure:  Tolerated well, no immediate complications Comments:     Wound on middle and index fingers are macerated and the partial flap is already black.  There is loss of tissue.  Cleansed thoroughly bacitracin and bulk dressing applied    (including critical care time)  Medications Ordered in ED Medications  lidocaine (XYLOCAINE) 2 % (with pres) injection (not administered)  THROMBI-PAD (THROMBI-PAD) 3"X3" pad (1 each  Given 09/13/16 0554)      Final Clinical Impressions(s) / ED Diagnoses   Final diagnoses:  Laceration of right hand without foreign body, initial encounter   The patient is nontoxic-appearing on exam and vital signs are normal.   After history, exam, and medical workup I feel the patient has been appropriately medically screened and is safe for discharge home. Pertinent diagnoses were discussed with the patient. Patient was given return precautions.  Strict return precautions for fevers, redness swelling streaking up the arm,  discharge or any concerns New Prescriptions New Prescriptions   No medications on file     Sinclair Alligood, MD 09/13/16 [phone]
